# Patient Record
Sex: Male | Born: 1940 | Race: White | Hispanic: No | Marital: Married
Health system: Southern US, Community
[De-identification: ages and names within clinical notes are randomized; demographics above are authoritative.]

## PROBLEM LIST (undated history)

## (undated) DIAGNOSIS — F32A Depression, unspecified: Secondary | ICD-10-CM

## (undated) DIAGNOSIS — I10 Essential (primary) hypertension: Secondary | ICD-10-CM

## (undated) DIAGNOSIS — K219 Gastro-esophageal reflux disease without esophagitis: Secondary | ICD-10-CM

---

## 2019-12-15 ENCOUNTER — Encounter (HOSPITAL_COMMUNITY): Payer: Self-pay | Admitting: Emergency Medicine

## 2019-12-15 ENCOUNTER — Emergency Department (HOSPITAL_COMMUNITY)
Admission: EM | Admit: 2019-12-15 | Discharge: 2019-12-15 | Disposition: A | Discharge status: Home / Self Care | Payer: Medicare Other | Attending: Emergency Medicine | Admitting: Emergency Medicine

## 2019-12-15 ENCOUNTER — Other Ambulatory Visit: Payer: Self-pay

## 2019-12-15 ENCOUNTER — Emergency Department (HOSPITAL_COMMUNITY): Payer: Medicare Other

## 2019-12-15 DIAGNOSIS — M25562 Pain in left knee: Secondary | ICD-10-CM | POA: Diagnosis not present

## 2019-12-15 DIAGNOSIS — M7918 Myalgia, other site: Secondary | ICD-10-CM | POA: Diagnosis not present

## 2019-12-15 DIAGNOSIS — I10 Essential (primary) hypertension: Secondary | ICD-10-CM | POA: Insufficient documentation

## 2019-12-15 HISTORY — DX: Depression, unspecified: F32.A

## 2019-12-15 HISTORY — DX: Essential (primary) hypertension: I10

## 2019-12-15 HISTORY — DX: Gastro-esophageal reflux disease without esophagitis: K21.9

## 2019-12-15 NOTE — Discharge Instructions (Addendum)
You were seen in the emergency department for evaluation of left knee pain.  Your x-rays did not show any obvious fracture or dislocation.  Most likely cause of your pain is an injury to the meniscus.  This is sometimes fixed operatively but you would benefit from a trial of ice ibuprofen and knee support.  We are giving you the number for orthopedic in the area.  If you experience any worsening or concerning symptoms please return to the emergency department.

## 2019-12-15 NOTE — ED Provider Notes (Signed)
[facility]ENCY DEPT Provider Note   CSN: [id number] Arrival date & time: 12/15/19  1612     History Chief Complaint  Patient presents with  . Knee Pain    Jared Ibarra is a 79 y.o. male.  He is complaining of few days of left knee pain after pivoting.  Its sharp and stabbing in nature.  It again happened today while he was trying to push a computer with his leg.  He thought his leg was going to buckle.  No numbness or weakness.  No calf pain.  He is visiting from [place] but stays here with family for weeks at a time.  The history is provided by the patient.  Knee Pain Location:  Knee Injury: yes   Mechanism of injury comment:  Pivot Knee location:  L knee Pain details:    Quality:  Sharp and shooting   Radiates to:  Does not radiate   Severity:  Moderate   Onset quality:  Sudden   Timing:  Intermittent   Progression:  Unchanged Chronicity:  New Dislocation: no   Relieved by:  Rest Worsened by:  Activity Ineffective treatments:  NSAIDs Associated symptoms: no back pain, no decreased ROM, no fever and no numbness        Past Medical History:  Diagnosis Date  . Depression   . GERD (gastroesophageal reflux disease)   . Hypertension     There are no problems to display for this patient.   History reviewed. No pertinent surgical history.     No family history on file.  Social History   Tobacco Use  . Smoking status: Not on file  Substance Use Topics  . Alcohol use: Not on file  . Drug use: Not on file    Home Medications Prior to Admission medications   Not on File    Allergies    Patient has no known allergies.  Review of Systems   Review of Systems  Constitutional: Negative for fever.  Musculoskeletal: Negative for back pain.  Skin: Negative for wound.    Physical Exam Updated Vital Signs BP (!) 165/92   Pulse 71   Temp 97.6 F (36.4 C) (Oral)   Resp 17   SpO2 99%   Physical Exam Vitals and  nursing note reviewed.  Constitutional:      Appearance: He is well-developed.  HENT:     Head: Normocephalic and atraumatic.  Eyes:     Conjunctiva/sclera: Conjunctivae normal.  Pulmonary:     Effort: Pulmonary effort is normal.  Musculoskeletal:        General: Tenderness present. No swelling or deformity. Normal range of motion.     Cervical back: Neck supple.     Right lower leg: No edema.     Left lower leg: No edema.     Comments: Left lower extremity full range of motion hip and ankle.  Knee has full range of motion but is uncomfortable.  No effusion.  No ligamentous laxity.  Has tenderness along both joint lines.  No overlying erythema or open wounds.  Skin:    General: Skin is warm and dry.     Capillary Refill: Capillary refill takes less than 2 seconds.  Neurological:     General: No focal deficit present.     Mental Status: He is alert.     GCS: GCS eye subscore is 4. GCS verbal subscore is 5. GCS motor subscore is 6.     Sensory: No sensory deficit.  Motor: No weakness.     ED Results / Procedures / Treatments   Labs (all labs ordered are listed, but only abnormal results are displayed) Labs Reviewed - No data to display  EKG None  Radiology DG Knee Complete 4 Views Left  Result Date: 12/15/2019 CLINICAL DATA:  Knee pain EXAM: LEFT KNEE - COMPLETE 4+ VIEW COMPARISON:  None. FINDINGS: No evidence of fracture, or dislocation. No evidence of arthropathy or other focal bone abnormality. Soft tissues are unremarkable. IMPRESSION: Negative. Electronically Signed   By: Jasmine Pang M.D.   On: 12/15/2019 18:43    Procedures Procedures (including critical care time)  Medications Ordered in ED Medications - No data to display  ED Course  I have reviewed the triage vital signs and the nursing notes.  Pertinent labs & imaging results that were available during my care of the patient were reviewed by me and considered in my medical decision making (see chart for  details).    MDM Rules/Calculators/A&P                         Differential diagnosis includes fracture, dislocation, ligament tear, meniscal injury.  His exam is most consistent with meniscal.  X-rays do not show any fracture or dislocation.  We will put a knee sleeve.  Given numbers for orthopedics here.  Recommended continue NSAIDs ice and gentle activity. Final Clinical Impression(s) / ED Diagnoses Final diagnoses:  Acute pain of left knee    Rx / DC Orders ED Discharge Orders    None       Terrilee Files, MD 12/16/19 1104

## 2019-12-15 NOTE — ED Triage Notes (Signed)
Patient visiting from out of town c/o left knee pain after playing with granddaughter.

## 2021-04-18 IMAGING — CR DG KNEE COMPLETE 4+V*L*
4 series · 4 of 4 positions shown · non-contrast
Comparison: None.

CLINICAL DATA: Knee pain

EXAM:
LEFT KNEE - COMPLETE 4+ VIEW

[t knee ap left]
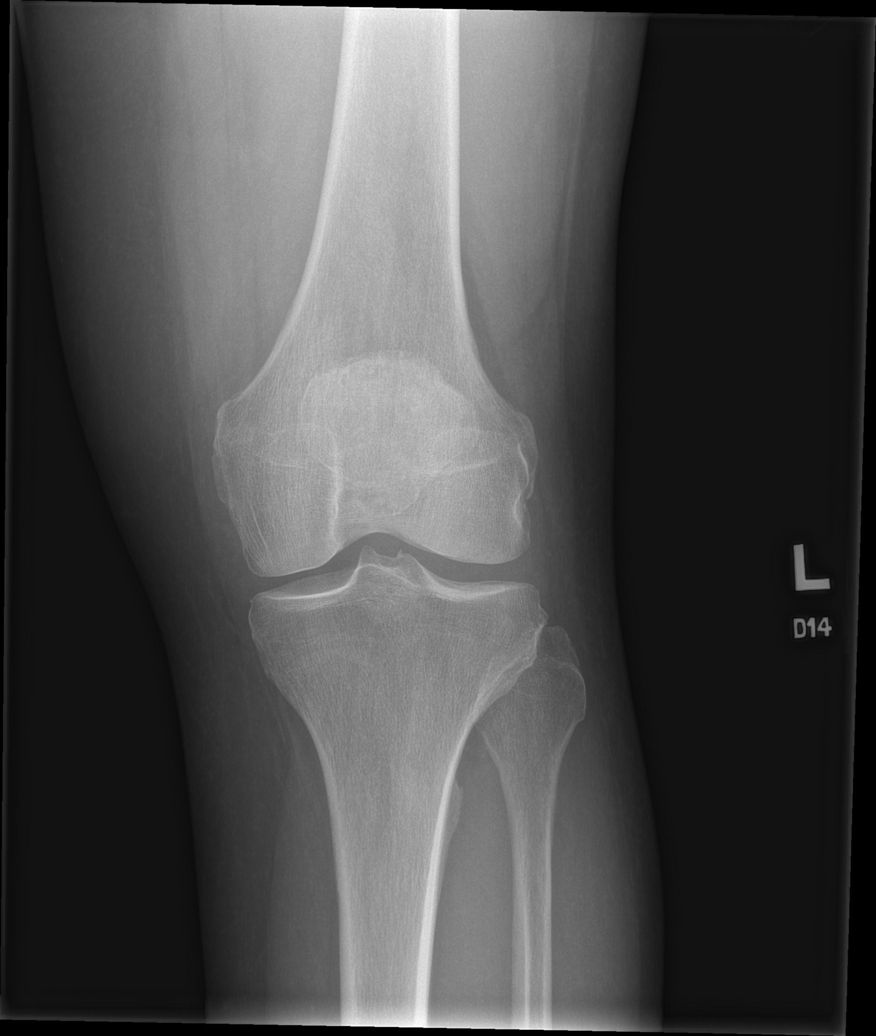

[t knee obl left (1 of 2)]
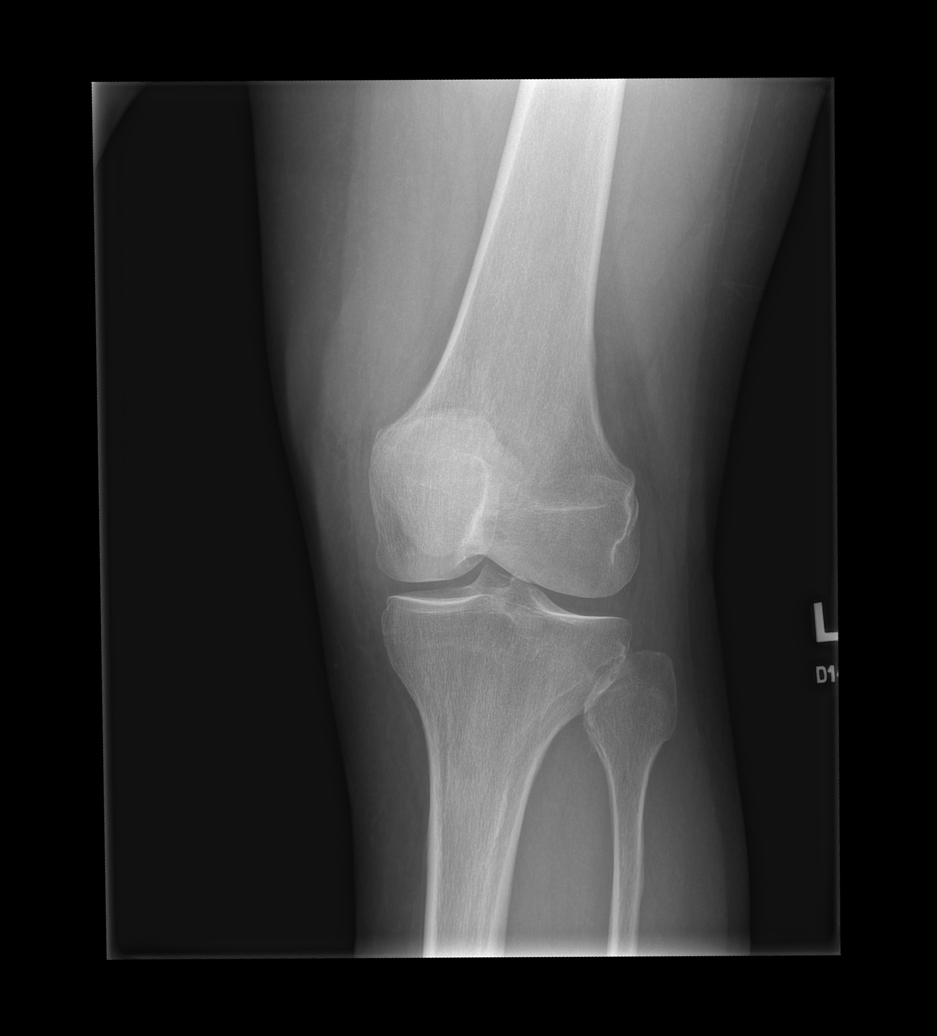

[t knee obl left (2 of 2)]
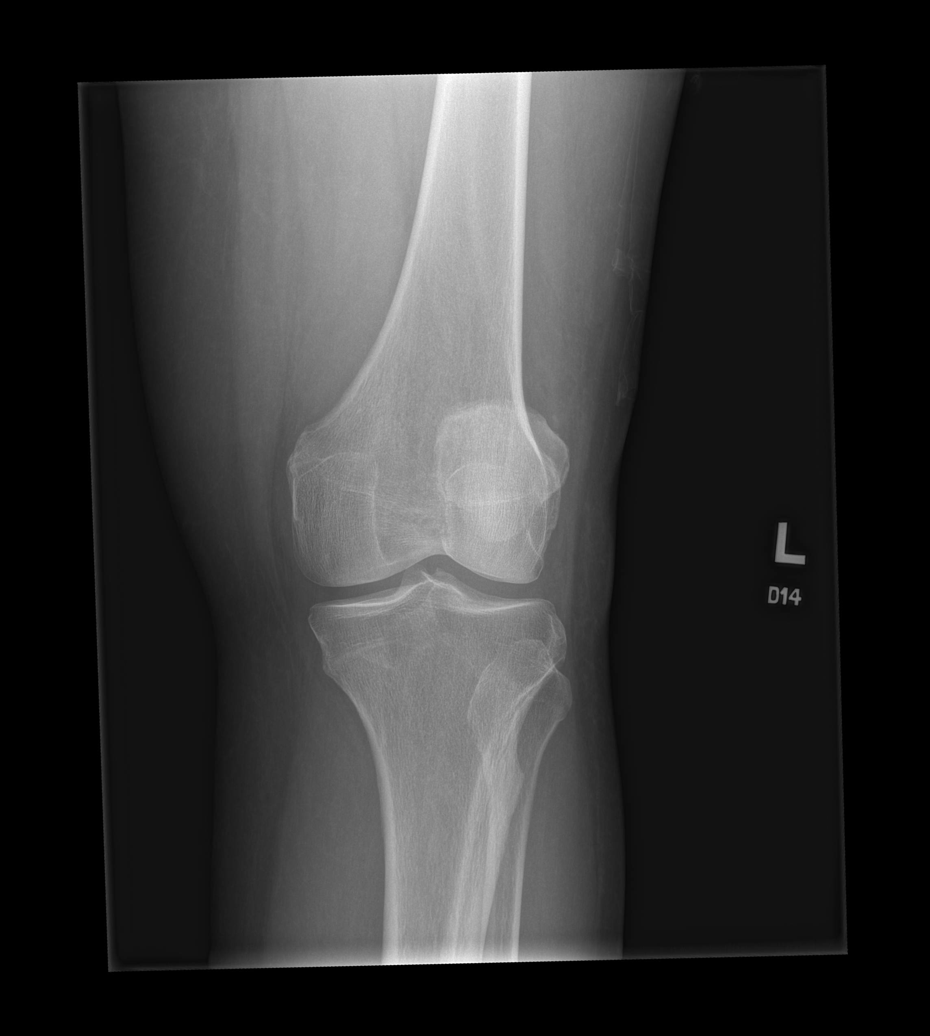

[t knee lat left]
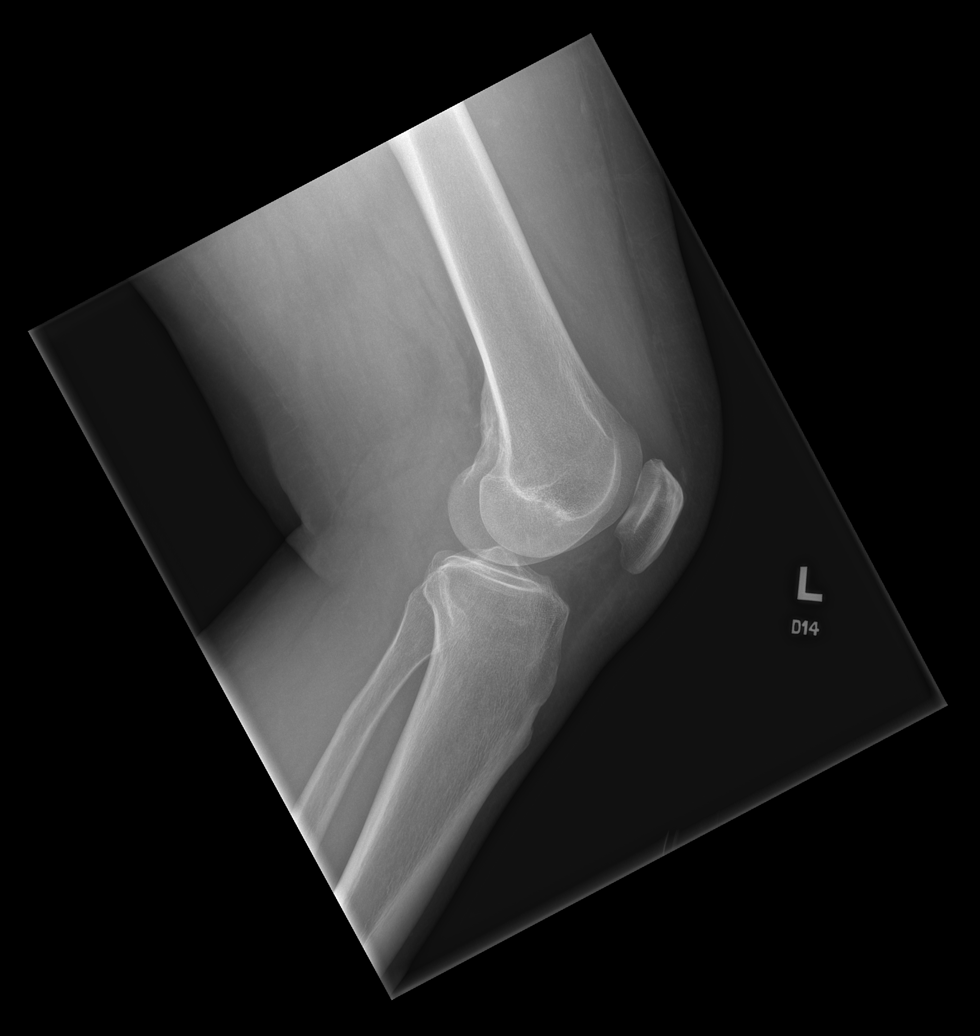

[4 of 4 positions shown; findings below may reference images not displayed]

FINDINGS: No evidence of fracture, or dislocation. No evidence of arthropathy
or other focal bone abnormality. Soft tissues are unremarkable.
IMPRESSION: Negative.
